# Patient Record
Sex: Male | Born: 1975 | Race: White | Hispanic: No | Marital: Single | Smoking: Never smoker
Health system: Southern US, Community
[De-identification: ages and names within clinical notes are randomized; demographics above are authoritative.]

## PROBLEM LIST (undated history)

## (undated) DIAGNOSIS — L4050 Arthropathic psoriasis, unspecified: Secondary | ICD-10-CM

## (undated) HISTORY — PX: KNEE SURGERY: SHX244

---

## 2020-11-24 ENCOUNTER — Ambulatory Visit
Admission: RE | Admit: 2020-11-24 | Discharge: 2020-11-24 | Disposition: A | Discharge status: Home / Self Care | Payer: Commercial Managed Care - PPO | Source: Ambulatory Visit | Attending: Emergency Medicine | Admitting: Emergency Medicine

## 2020-11-24 ENCOUNTER — Other Ambulatory Visit: Payer: Self-pay

## 2020-11-24 ENCOUNTER — Ambulatory Visit (HOSPITAL_BASED_OUTPATIENT_CLINIC_OR_DEPARTMENT_OTHER)
Admission: RE | Admit: 2020-11-24 | Discharge: 2020-11-24 | Disposition: A | Discharge status: Home / Self Care | Payer: Commercial Managed Care - PPO | Source: Ambulatory Visit | Attending: Emergency Medicine | Admitting: Emergency Medicine

## 2020-11-24 VITALS — BP 146/83 | HR 71 | Temp 98.6°F | Resp 17 | Wt 225.0 lb

## 2020-11-24 DIAGNOSIS — R0789 Other chest pain: Secondary | ICD-10-CM | POA: Diagnosis not present

## 2020-11-24 DIAGNOSIS — M549 Dorsalgia, unspecified: Secondary | ICD-10-CM | POA: Insufficient documentation

## 2020-11-24 DIAGNOSIS — M546 Pain in thoracic spine: Secondary | ICD-10-CM

## 2020-11-24 DIAGNOSIS — R079 Chest pain, unspecified: Secondary | ICD-10-CM | POA: Insufficient documentation

## 2020-11-24 MED ORDER — NAPROXEN 500 MG PO TABS: 500.0000 mg | ORAL_TABLET | Freq: Two times a day (BID) | ORAL | 0 refills | Status: DC

## 2020-11-24 NOTE — Discharge Instructions (Addendum)
EKG normal Please go for chest x-ray-I will call with results Please use Naprosyn twice daily with food over the next 4 to 5 days and monitor discomfort Please follow-up if not improving or worsening

## 2020-11-24 NOTE — ED Provider Notes (Signed)
[facility]    CSN: [id number] Arrival date & time: 11/24/20  1015      History   Chief Complaint Chief Complaint  Patient presents with   Back Pain    HPI Darren Gomez is a 45 y.o. male presenting today for evaluation of chest and back pain.  Reports increased discomfort in chest with inspiration.  Symptoms have been intermittent over the past couple days, but more persistent over the past 24 hours.  Denies injury or trauma.  Denies increase in activity or heavy lifting.  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  He denies any recent cough, fevers or congestion.  He denies history of hypertension, diabetes or tobacco use.  Denies history of underlying lung problems.  He denies any nausea vomiting or abdominal pain.  Denies any sore throat or sour taste in mouth.  Has previously had history of heartburn, but symptoms feel different today.  Has not taken any medicines for symptoms.  Denies leg pain or leg swelling.  Denies any prior DVT/PE.  Denies recent travel, immobilization or surgery.  HPI  History reviewed. No pertinent past medical history.  There are no problems to display for this patient.   History reviewed. No pertinent surgical history.     Home Medications    Prior to Admission medications   Medication Sig Start Date End Date Taking? Authorizing Provider  naproxen (NAPROSYN) 500 MG tablet Take 1 tablet (500 mg total) by mouth 2 (two) times daily. 11/24/20  Yes Cameo Schmiesing, [place] C, PA-C    Family History No family history on file.  Social History Social History   Tobacco Use   Smoking status: Never   Smokeless tobacco: Never  Substance Use Topics   Alcohol use: Yes   Drug use: Never     Allergies   Azithromycin and Hydrocodone-acetaminophen   Review of Systems Review of Systems  Constitutional:  Negative for activity change, appetite change, chills, fatigue and fever.  HENT:  Negative for congestion, ear pain, rhinorrhea, sinus  pressure, sore throat and trouble swallowing.   Eyes:  Negative for discharge and redness.  Respiratory:  Negative for cough, chest tightness and shortness of breath.   Cardiovascular:  Positive for chest pain.  Gastrointestinal:  Negative for abdominal pain, diarrhea, nausea and vomiting.  Musculoskeletal:  Positive for back pain. Negative for myalgias.  Skin:  Negative for rash.  Neurological:  Negative for dizziness, light-headedness and headaches.    Physical Exam Triage Vital Signs ED Triage Vitals  Enc Vitals Group     BP      Pulse      Resp      Temp      Temp src      SpO2      Weight      Height      Head Circumference      Peak Flow      Pain Score      Pain Loc      Pain Edu?      Excl. in GC?    No data found.  Updated Vital Signs BP (!) 146/83 (BP Location: Right Arm)   Pulse 71   Temp 98.6 F (37 C) (Oral)   Resp 17   Wt 225 lb (102.1 kg)   SpO2 98%   Visual Acuity Right Eye Distance:   Left Eye Distance:   Bilateral Distance:    Right Eye Near:   Left Eye Near:  Bilateral Near:     Physical Exam Vitals and nursing note reviewed.  Constitutional:      Appearance: He is well-developed.     Comments: No acute distress  HENT:     Head: Normocephalic and atraumatic.     Nose: Nose normal.     Mouth/Throat:     Comments: Oral mucosa pink and moist, no tonsillar enlargement or exudate. Posterior pharynx patent and nonerythematous, no uvula deviation or swelling. Normal phonation.  Eyes:     Conjunctiva/sclera: Conjunctivae normal.  Cardiovascular:     Rate and Rhythm: Normal rate and regular rhythm.  Pulmonary:     Effort: Pulmonary effort is normal. No respiratory distress.     Comments: Breathing comfortably at rest, CTABL, no wheezing, rales or other adventitious sounds auscultated  Abdominal:     General: There is no distension.  Musculoskeletal:        General: Normal range of motion.     Cervical back: Neck supple.      Comments: Nontender to palpation along cervical, thoracic and lumbar spine midline, no palpable deformity or step-off, no significant reproducible tenderness palpation throughout cervical musculature and thoracic musculature bilaterally  Anterior chest without any significant reproducible tenderness  Moving extremities appropriately Ambulation without abnormality  Skin:    General: Skin is warm and dry.  Neurological:     Mental Status: He is alert and oriented to person, place, and time.     UC Treatments / Results  Labs (all labs ordered are listed, but only abnormal results are displayed) Labs Reviewed - No data to display  EKG   Radiology No results found.  Procedures Procedures (including critical care time)  Medications Ordered in UC Medications - No data to display  Initial Impression / Assessment and Plan / UC Course  I have reviewed the triage vital signs and the nursing notes.  Pertinent labs & imaging results that were available during my care of the patient were reviewed by me and considered in my medical decision making (see chart for details).     Chest and thoracic back pain-EKG normal sinus rhythm, no acute signs of ischemia or infarction, does have isolated nonspecific T wave inversion in lead III.  No reproducible tenderness, sending patient for chest x-ray, and chest x-ray also unremarkable we will proceed with trial of NSAIDs for chest wall inflammation.  Discussed strict return precautions. Patient verbalized understanding and is agreeable with plan.  Final Clinical Impressions(s) / UC Diagnoses   Final diagnoses:  Chest wall pain  Acute bilateral thoracic back pain     Discharge Instructions      EKG normal Please go for chest x-ray-I will call with results Please use Naprosyn twice daily with food over the next 4 to 5 days and monitor discomfort Please follow-up if not improving or worsening     ED Prescriptions     Medication Sig  Dispense Auth. Provider   naproxen (NAPROSYN) 500 MG tablet Take 1 tablet (500 mg total) by mouth 2 (two) times daily. 30 tablet Windel Keziah, [place] C, PA-C      PDMP not reviewed this encounter.   Lew Dawes, PA-C 11/24/20 1124

## 2020-11-24 NOTE — ED Triage Notes (Signed)
For a couple of days has had pain in back between shoulder blades and down sides of ribs and across chest.  No injury to note.  Laying flat or bending over makes it worse.

## 2021-05-23 ENCOUNTER — Other Ambulatory Visit: Payer: Self-pay

## 2021-05-23 ENCOUNTER — Ambulatory Visit
Admission: RE | Admit: 2021-05-23 | Discharge: 2021-05-23 | Disposition: A | Discharge status: Home / Self Care | Payer: Commercial Managed Care - PPO | Source: Ambulatory Visit

## 2021-05-23 VITALS — BP 132/91 | HR 76 | Temp 98.3°F | Resp 20

## 2021-05-23 DIAGNOSIS — H6122 Impacted cerumen, left ear: Secondary | ICD-10-CM | POA: Diagnosis not present

## 2021-05-23 DIAGNOSIS — R42 Dizziness and giddiness: Secondary | ICD-10-CM | POA: Diagnosis not present

## 2021-05-23 DIAGNOSIS — H905 Unspecified sensorineural hearing loss: Secondary | ICD-10-CM

## 2021-05-23 MED ORDER — FLUTICASONE PROPIONATE 50 MCG/ACT NA SUSP: 2.0000 | Freq: Every day | NASAL | 0 refills | Status: AC

## 2021-05-23 MED ORDER — MECLIZINE HCL 25 MG PO TABS: 25.0000 mg | ORAL_TABLET | Freq: Three times a day (TID) | ORAL | 0 refills | Status: AC | PRN

## 2021-05-23 MED ORDER — CETIRIZINE HCL 10 MG PO TABS: 10.0000 mg | ORAL_TABLET | Freq: Every day | ORAL | 2 refills | Status: AC

## 2021-05-23 NOTE — ED Provider Notes (Signed)
[facility]    CSN: [id number] Arrival date & time: 05/23/21  1041    HISTORY   Chief Complaint  Patient presents with   Dizziness   Appointment   HPI Cash Duce is a 46 y.o. male. Pt reports being dx with vertigo about 5 years ago for which he was prescribed meclizine 3 times daily, states symptoms largely resolved but recently he began having dizziness again.  He reports he only takes antivert at night as it makes him sleepy.  Patient states that in 5 weeks he is going to the [place] and came in to urgent care today "to get checked out".  Patient states that he does not have a primary care provider.  Patient denies palpitations, irregular heartbeat, nausea, vomiting, shortness of breath, syncope, presyncope.q  The history is provided by the patient.  History reviewed. No pertinent past medical history. There are no problems to display for this patient.  History reviewed. No pertinent surgical history.  Home Medications    Prior to Admission medications   Medication Sig Start Date End Date Taking? Authorizing Provider  naproxen (NAPROSYN) 500 MG tablet Take 1 tablet (500 mg total) by mouth 2 (two) times daily. 11/24/20   Wieters, Junius Creamer, PA-C    Family History History reviewed. No pertinent family history. Social History Social History   Tobacco Use   Smoking status: Never   Smokeless tobacco: Never  Substance Use Topics   Alcohol use: Yes   Drug use: Never   Allergies   Azithromycin and Hydrocodone-acetaminophen  Review of Systems Review of Systems Pertinent findings noted in history of present illness.   Physical Exam Triage Vital Signs ED Triage Vitals  Enc Vitals Group     BP 02/03/21 0827 (!) 147/82     Pulse Rate 02/03/21 0827 72     Resp 02/03/21 0827 18     Temp 02/03/21 0827 98.3 F (36.8 C)     Temp Source 02/03/21 0827 Oral     SpO2 02/03/21 0827 98 %     Weight --      Height --      Head Circumference --       Peak Flow --      Pain Score 02/03/21 0826 5     Pain Loc --      Pain Edu? --      Excl. in GC? --   No data found.  Updated Vital Signs BP (!) 132/91 (BP Location: Right Arm)    Pulse 76    Temp 98.3 F (36.8 C) (Oral)    Resp 20    SpO2 95%   Physical Exam  Visual Acuity Right Eye Distance:   Left Eye Distance:   Bilateral Distance:    Right Eye Near:   Left Eye Near:    Bilateral Near:     UC Couse / Diagnostics / Procedures:    EKG  Radiology No results found.  Procedures Ear Cerumen Removal  Date/Time: 05/23/2021 11:29 AM Performed by: Rexene Edison, RN Authorized by: Theadora Rama Scales, PA-C   Consent:    Consent obtained:  Verbal   Risks, benefits, and alternatives were discussed: yes     Risks discussed:  Bleeding, dizziness, incomplete removal, infection, pain and TM perforation   Alternatives discussed:  No treatment, delayed treatment, alternative treatment, observation and referral Universal protocol:    Procedure explained and questions answered to patient or proxy's satisfaction: yes     Patient  identity confirmed:  Verbally with patient and arm band Procedure details:    Location:  L ear   Procedure type: irrigation   Post-procedure details:    Inspection:  TM intact and ear canal clear   Hearing quality:  Normal   Procedure completion:  Tolerated (including critical care time)  UC Diagnoses / Final Clinical Impressions(s)   I have reviewed the triage vital signs and the nursing notes.  Pertinent labs & imaging results that were available during my care of the patient were reviewed by me and considered in my medical decision making (see chart for details).    Final diagnoses:  Sensorineural hearing loss (SNHL) of left ear, unspecified hearing status on contralateral side  Cerumen debris on tympanic membrane of left ear  Dizziness   Cerumen in left ear removed without incident, patient states dizziness was unchanged, hearing  unchanged.  Patient provided with renewal of prescription for meclizine and prescriptions for Flonase and Zyrtec.  Have initiated a request to help patient find a primary care provider for follow-up.  Return precautions advised.  ED Prescriptions     Medication Sig Dispense Auth. Provider   fluticasone (FLONASE) 50 MCG/ACT nasal spray Place 2 sprays into both nostrils daily. 18 mL Theadora Rama Scales, PA-C   cetirizine (ZYRTEC ALLERGY) 10 MG tablet Take 1 tablet (10 mg total) by mouth at bedtime. 30 tablet Theadora Rama Scales, PA-C   meclizine (ANTIVERT) 25 MG tablet Take 1 tablet (25 mg total) by mouth 3 (three) times daily as needed for dizziness. 90 tablet Theadora Rama Scales, [place]      PDMP not reviewed this encounter.  Pending results:  Labs Reviewed - No data to display  Medications Ordered in UC: Medications - No data to display  Disposition Upon Discharge:  Condition: stable for discharge home Home: take medications as prescribed; routine discharge instructions as discussed; follow up as advised.  Patient presented with an acute illness with associated systemic symptoms and significant discomfort requiring urgent management. In my opinion, this is a condition that a prudent lay person (someone who possesses an average knowledge of health and medicine) may potentially expect to result in complications if not addressed urgently such as respiratory distress, impairment of bodily function or dysfunction of bodily organs.   Routine symptom specific, illness specific and/or disease specific instructions were discussed with the patient and/or caregiver at length.   As such, the patient has been evaluated and assessed, work-up was performed and treatment was provided in alignment with urgent care protocols and evidence based medicine.  Patient/parent/caregiver has been advised that the patient may require follow up for further testing and treatment if the symptoms continue in  spite of treatment, as clinically indicated and appropriate.  If the patient was tested for COVID-19, Influenza and/or RSV, then the patient/parent/guardian was advised to isolate at home pending the results of his/her diagnostic coronavirus test and potentially longer if theyre positive. I have also advised pt that if his/her COVID-19 test returns positive, it's recommended to self-isolate for at least 10 days after symptoms first appeared AND until fever-free for 24 hours without fever reducer AND other symptoms have improved or resolved. Discussed self-isolation recommendations as well as instructions for household member/close contacts as per the [facility] and [place] DHHS, and also gave patient the COVID packet with this information.  Patient/parent/caregiver has been advised to return to the [facility] or PCP in 3-5 days if no better; to PCP or the Emergency Department if new signs and  symptoms develop, or if the current signs or symptoms continue to change or worsen for further workup, evaluation and treatment as clinically indicated and appropriate  The patient will follow up with their current PCP if and as advised. If the patient does not currently have a PCP we will assist them in obtaining one.   The patient may need specialty follow up if the symptoms continue, in spite of conservative treatment and management, for further workup, evaluation, consultation and treatment as clinically indicated and appropriate.   Patient/parent/caregiver verbalized understanding and agreement of plan as discussed.  All questions were addressed during visit.  Please see discharge instructions below for further details of plan.  Discharge Instructions:   Discharge Instructions      Causes of dizziness can vary from underlying cardiac dysrhythmia, neurological dysfunction in the outer and inner ear, upper respiratory infections, hearing loss, visual disturbances among many other things.  Testing you for all of these  possibilities in an attempt to rule them out is not something that we are able to provide for you here in the urgent care setting.  Based on limited physical exam findings, I recommended removal of earwax as this could possibly be contributing to your sensation of dizziness.  Help.  Thank you for visiting urgent care today.  I renewed your prescriptions for meclizine and provided you with prescriptions for Zyrtec and Flonase to address possible symptoms of allergic rhinitis which may be causing excess fluid buildup in your ears and postnasal drip.    This office note has been dictated using Teaching laboratory technician.  Unfortunately, and despite my best efforts, this method of dictation can sometimes lead to occasional typographical or grammatical errors.  I apologize in advance if this occurs.     Theadora Rama Scales, PA-C 05/23/21 1205

## 2021-05-23 NOTE — Discharge Instructions (Addendum)
Causes of dizziness can vary from underlying cardiac dysrhythmia, neurological dysfunction in the outer and inner ear, upper respiratory infections, hearing loss, visual disturbances among many other things.  Testing you for all of these possibilities in an attempt to rule them out is not something that we are able to provide for you here in the urgent care setting.  Based on limited physical exam findings, I recommended removal of earwax as this could possibly be contributing to your sensation of dizziness.  Help.  Thank you for visiting urgent care today.  I renewed your prescriptions for meclizine and provided you with prescriptions for Zyrtec and Flonase to address possible symptoms of allergic rhinitis which may be causing excess fluid buildup in your ears and postnasal drip.

## 2021-05-23 NOTE — ED Triage Notes (Signed)
Pt reports being dx with vertigo and recently having an episode of dizziness.  He reports he does take the antivert at night as it makes him sleep.

## 2022-08-04 IMAGING — DX DG CHEST 2V
2 series · 2 of 2 positions shown · non-contrast
Comparison: None

CLINICAL DATA: Chest and back pain for 2 days worsened by
inspiration, no injury

EXAM:
CHEST - 2 VIEW

[chest pa]
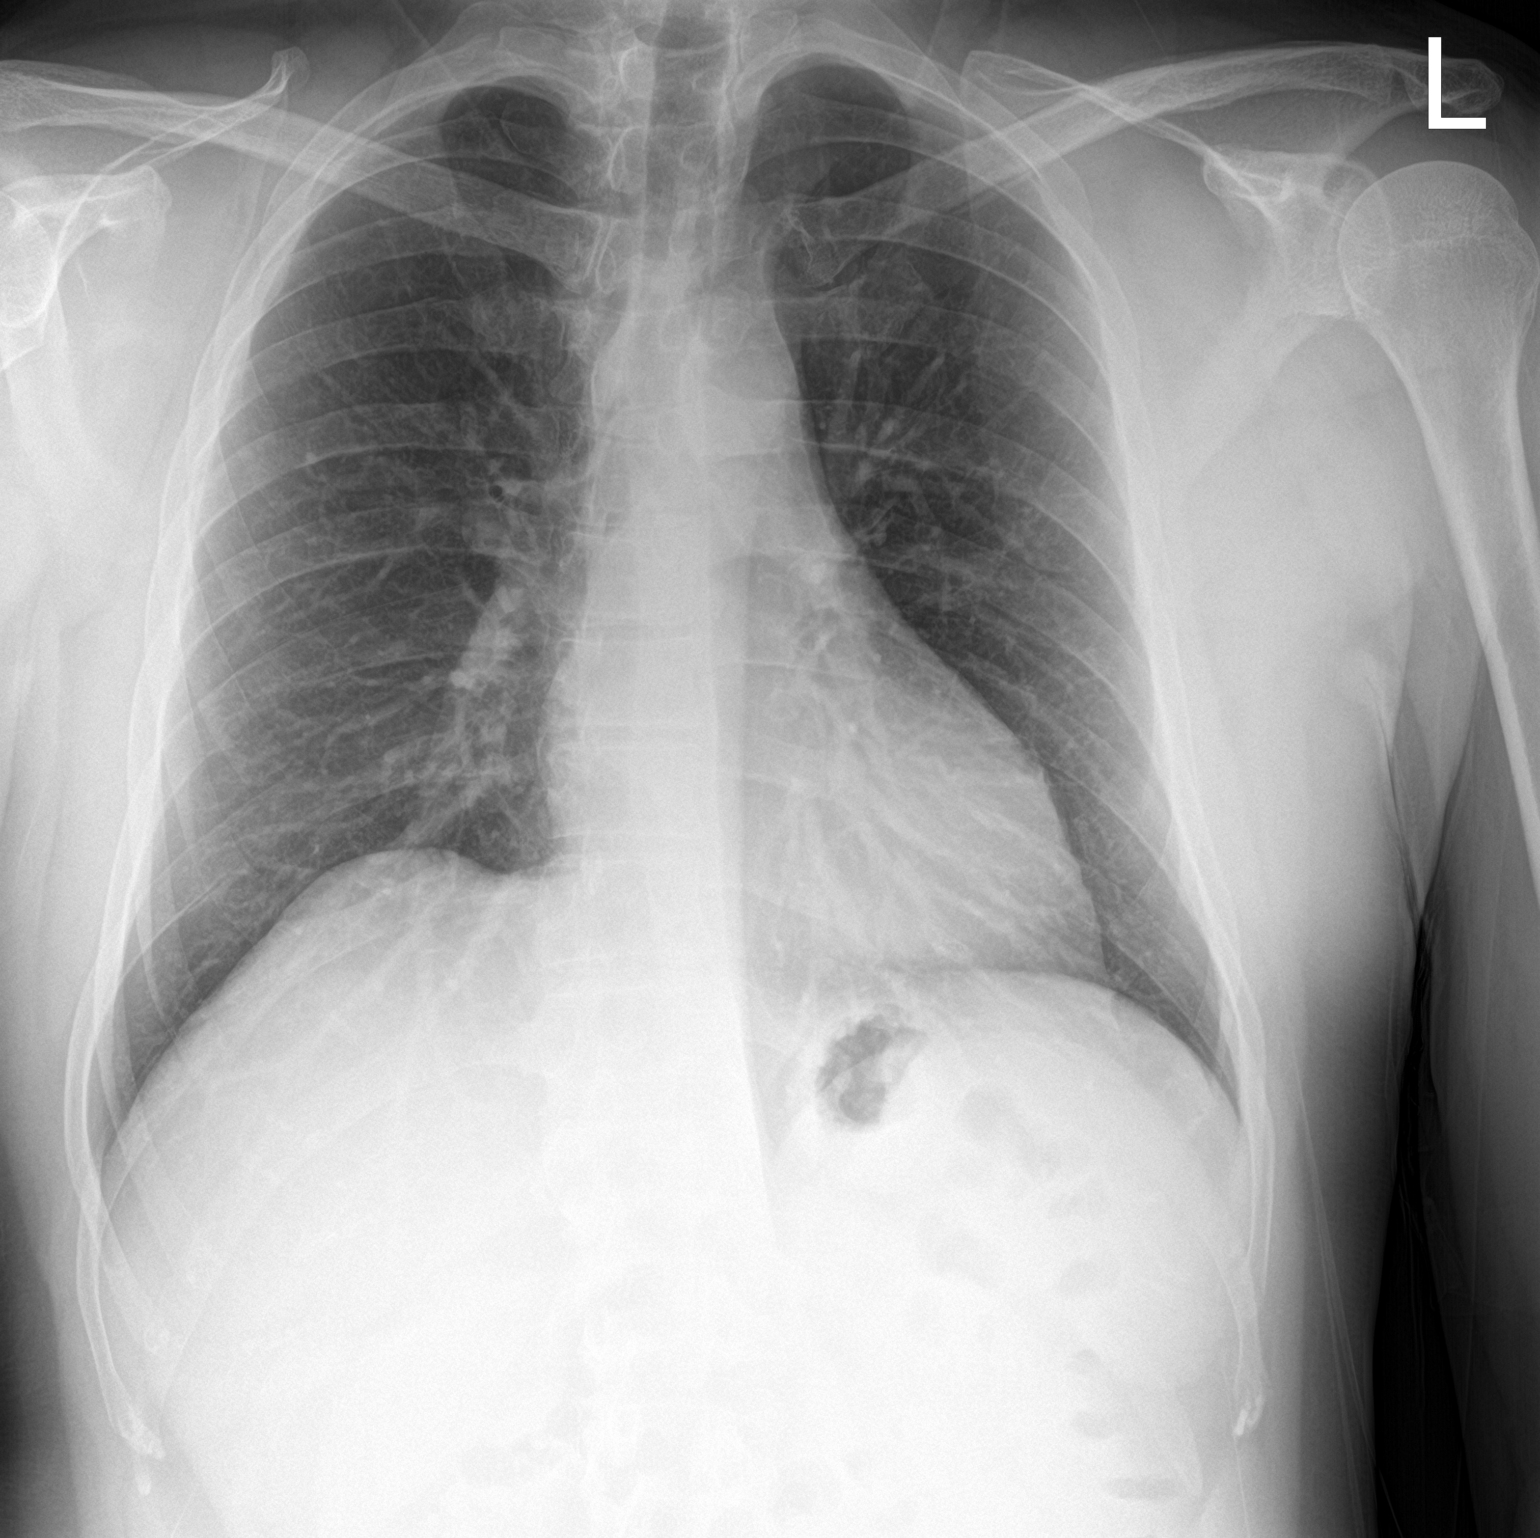

[chest lat]
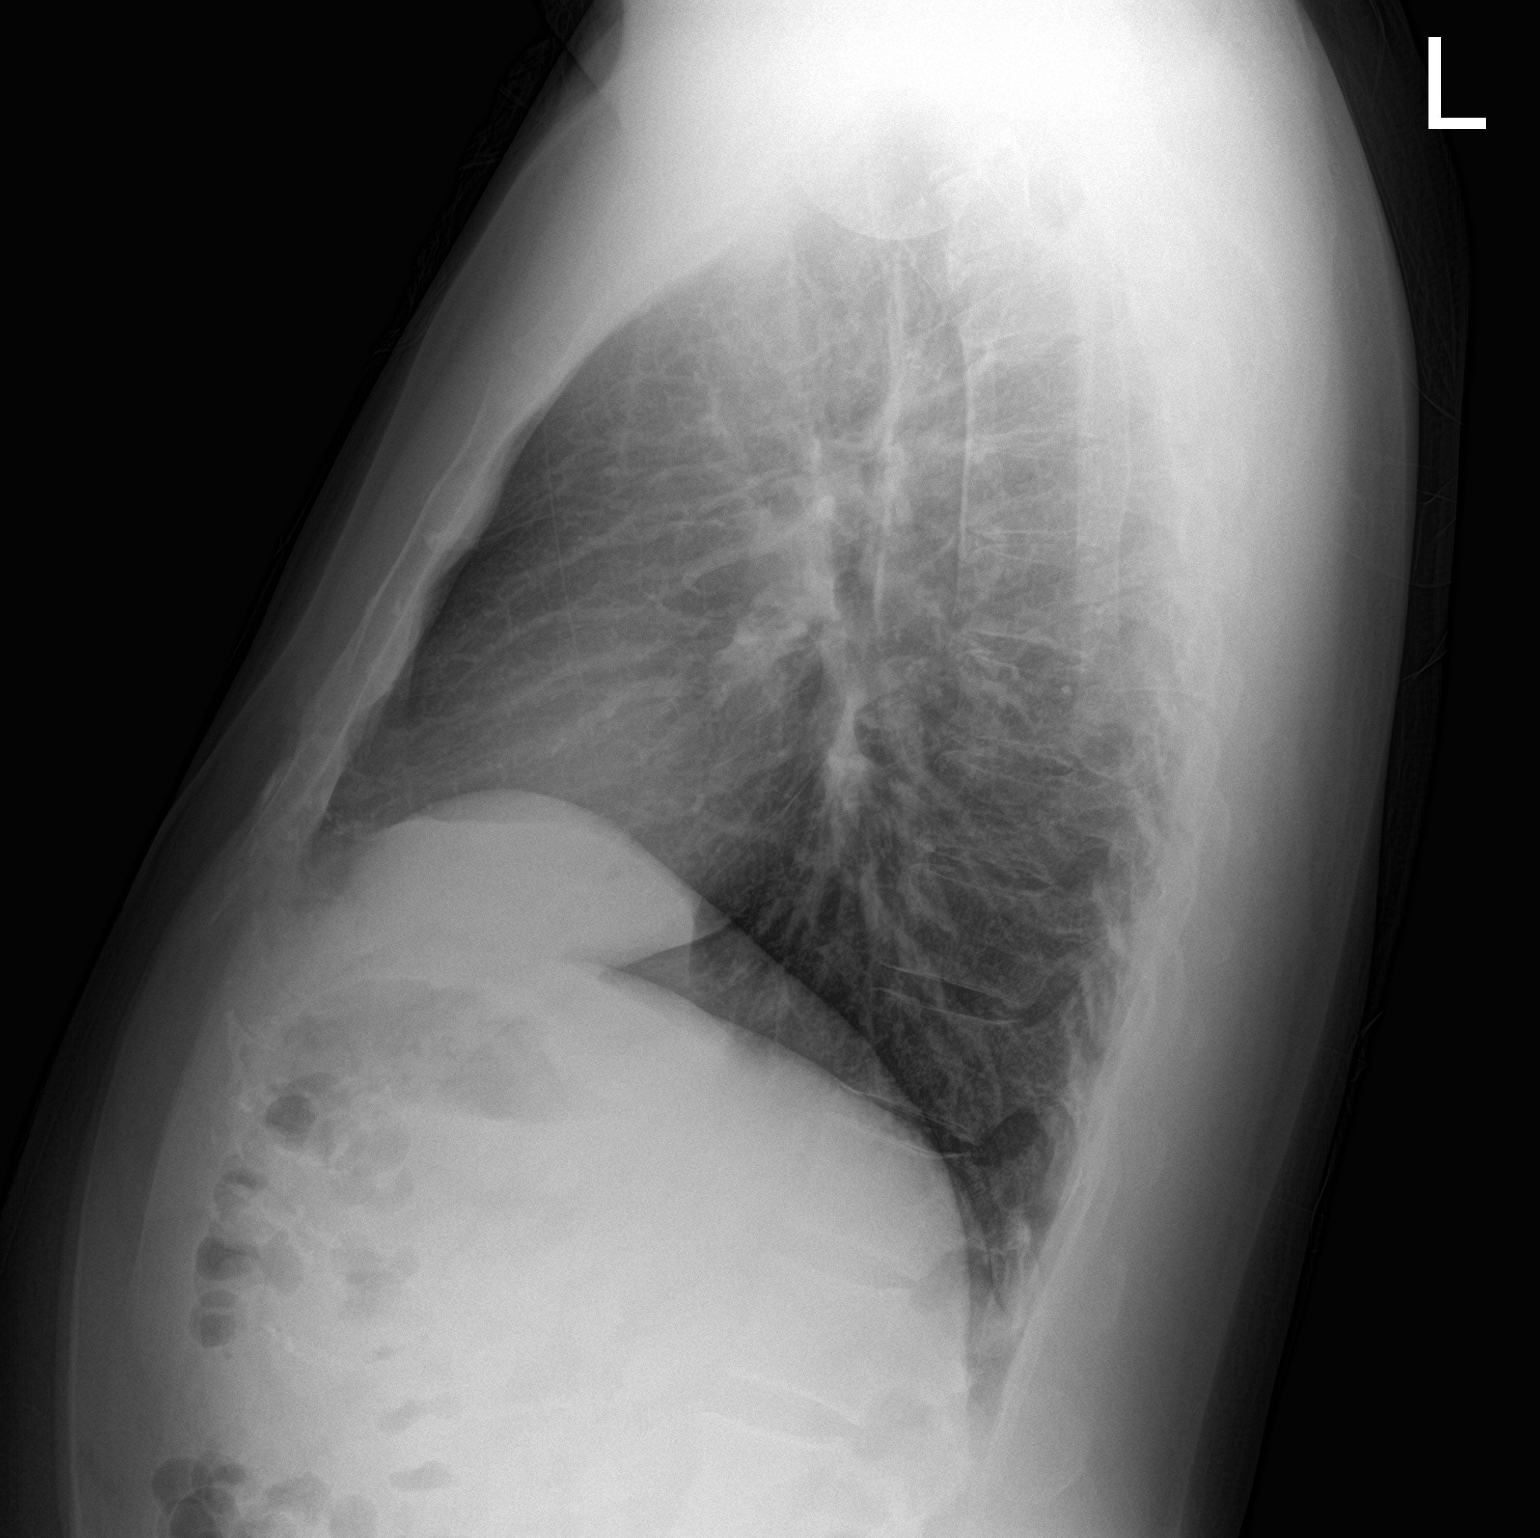

[2 of 2 positions shown; findings below may reference images not displayed]

FINDINGS: Normal heart size, mediastinal contours, and pulmonary vascularity.

Lungs clear.

No pleural effusion or pneumothorax.

Bones unremarkable.
IMPRESSION: Normal exam.

## 2023-01-18 ENCOUNTER — Ambulatory Visit
Admission: EM | Admit: 2023-01-18 | Discharge: 2023-01-18 | Disposition: A | Discharge status: Home / Self Care | Payer: Commercial Managed Care - PPO | Attending: Internal Medicine | Admitting: Internal Medicine

## 2023-01-18 ENCOUNTER — Ambulatory Visit (HOSPITAL_BASED_OUTPATIENT_CLINIC_OR_DEPARTMENT_OTHER)
Admission: RE | Admit: 2023-01-18 | Discharge: 2023-01-18 | Disposition: A | Discharge status: Home / Self Care | Payer: Commercial Managed Care - PPO | Source: Ambulatory Visit | Attending: Urgent Care | Admitting: Urgent Care

## 2023-01-18 DIAGNOSIS — L03113 Cellulitis of right upper limb: Secondary | ICD-10-CM | POA: Insufficient documentation

## 2023-01-18 DIAGNOSIS — D849 Immunodeficiency, unspecified: Secondary | ICD-10-CM | POA: Diagnosis present

## 2023-01-18 HISTORY — DX: Arthropathic psoriasis, unspecified: L40.50

## 2023-01-18 MED ORDER — NAPROXEN 500 MG PO TABS: 500.0000 mg | ORAL_TABLET | Freq: Two times a day (BID) | ORAL | 0 refills | Status: AC

## 2023-01-18 MED ORDER — DOXYCYCLINE HYCLATE 100 MG PO CAPS: 100.0000 mg | ORAL_CAPSULE | Freq: Two times a day (BID) | ORAL | 0 refills | Status: AC

## 2023-01-18 NOTE — ED Provider Notes (Signed)
[facility]  Note:  This document was prepared using Conservation officer, historic buildings and may include unintentional dictation errors.  MRN: [id number] DOB: Feb 04, 1976  Subjective:   Darren Gomez is a 47 y.o. male presenting for 1 day history of acute onset moderate right elbow pain, swelling and redness.  He also feels a hot sensation to the elbow.  Is having difficulty fully bending and extending.  Patient awoke with the symptoms.  No trauma, open wounds, drainage.  Patient takes methadone prostate for psoriatic arthritis and is therefore immunocompromise.  No current facility-administered medications for this encounter.  Current Outpatient Medications:    folic acid (FOLVITE) 1 MG tablet, 1 tablet Orally Once a day for 30 days, Disp: , Rfl:    meloxicam (MOBIC) 15 MG tablet, 1 tablet Orally Once a day for 30 days, Disp: , Rfl:    cetirizine (ZYRTEC ALLERGY) 10 MG tablet, Take 1 tablet (10 mg total) by mouth at bedtime., Disp: 30 tablet, Rfl: 2   fluticasone (FLONASE) 50 MCG/ACT nasal spray, Place 2 sprays into both nostrils daily., Disp: 18 mL, Rfl: 0   methotrexate (RHEUMATREX) 2.5 MG tablet, Take 20 mg by mouth once a week., Disp: , Rfl:    naproxen (NAPROSYN) 500 MG tablet, Take 1 tablet (500 mg total) by mouth 2 (two) times daily., Disp: 30 tablet, Rfl: 0   Allergies  Allergen Reactions   Azithromycin Anaphylaxis   Codeine Itching   Hydrocodone-Acetaminophen Itching    Past Medical History:  Diagnosis Date   Psoriatic arthritis (HCC)    per pt     Past Surgical History:  Procedure Laterality Date   KNEE SURGERY     per pt    No family history on file.  Social History   Tobacco Use   Smoking status: Never   Smokeless tobacco: Never  Vaping Use   Vaping status: Never Used  Substance Use Topics   Alcohol use: Not Currently   Drug use: Never    ROS   Objective:   Vitals: BP 132/88 (BP Location: Left Arm)   Pulse (!) 102    Temp 99 F (37.2 C) (Oral)   Resp 16   SpO2 96%   Physical Exam Constitutional:      General: He is not in acute distress.    Appearance: Normal appearance. He is well-developed and normal weight. He is not ill-appearing, toxic-appearing or diaphoretic.  HENT:     Head: Normocephalic and atraumatic.     Right Ear: External ear normal.     Left Ear: External ear normal.     Nose: Nose normal.     Mouth/Throat:     Pharynx: Oropharynx is clear.  Eyes:     General: No scleral icterus.       Right eye: No discharge.        Left eye: No discharge.     Extraocular Movements: Extraocular movements intact.  Cardiovascular:     Rate and Rhythm: Normal rate.  Pulmonary:     Effort: Pulmonary effort is normal.  Musculoskeletal:     Right elbow: Swelling present. No deformity, effusion or lacerations. Decreased range of motion. Tenderness present in olecranon process. No radial head, medial epicondyle or lateral epicondyle tenderness.       Arms:     Cervical back: Normal range of motion.  Neurological:     Mental Status: He is alert and oriented to person, place, and time.  Psychiatric:  Mood and Affect: Mood normal.        Behavior: Behavior normal.        Thought Content: Thought content normal.        Judgment: Judgment normal.     Assessment and Plan :   PDMP not reviewed this encounter.  1. Cellulitis of right elbow   2. Immunocompromised (HCC)    Will pursue outpatient imaging, x-ray order placed, effort is to rule out septic bursitis, septic arthritis.  Otherwise, recommend starting doxycycline for cellulitis of the right elbow.  Use naproxen for pain and inflammation.  Maintain strict ER precautions.  Counseled patient on potential for adverse effects with medications prescribed today, patient verbalized understanding.    Wallis Bamberg, PA-C 01/18/23 1459

## 2023-01-18 NOTE — Discharge Instructions (Addendum)
Please start doxycycline as soon as possible. Use naproxen for pain and inflammation. I have placed orders to have an x-ray done at the med center in [facility].  Please had there now.  Go through the main hospital and not the emergency room.  Once you are there and let them know that you will came to our clinic and we send she to their facility for an outpatient x-ray.  If no one is at the front desk then they are likely out the rest of the day and at that point you would have to go through the emergency room.  Do not check in as a patient through the emergency room.  Simply let them know that you are there for an outpatient x-ray from our clinic.  I will call you with your results and update our treatment plan if necessary after I get the report.    There is a possibility you have a septic joint, a severe infection involving your elbow joint. This would be a medical emergency and would need to be seen through the ER. If you continue to worsen despite taking this antibiotic by Sunday, go to the emergency room. This includes fever, worsening pain and swelling.

## 2023-01-18 NOTE — ED Triage Notes (Signed)
Pt states he woke this am with pain, redness swelling to right elbow-denies injury-no pain meds PTA-NAD-steady gait
